# Patient Record
Sex: Female | Born: 2000 | Race: Black or African American | Hispanic: No | Marital: Single
Health system: Southern US, Community
[De-identification: ages and names within clinical notes are randomized; demographics above are authoritative.]

---

## 2021-03-10 ENCOUNTER — Encounter (HOSPITAL_COMMUNITY): Payer: Self-pay

## 2021-03-10 ENCOUNTER — Emergency Department (HOSPITAL_COMMUNITY): Payer: 59

## 2021-03-10 ENCOUNTER — Emergency Department (HOSPITAL_COMMUNITY)
Admission: EM | Admit: 2021-03-10 | Discharge: 2021-03-10 | Disposition: A | Discharge status: Home / Self Care | Payer: 59 | Attending: Emergency Medicine | Admitting: Emergency Medicine

## 2021-03-10 DIAGNOSIS — J189 Pneumonia, unspecified organism: Secondary | ICD-10-CM

## 2021-03-10 DIAGNOSIS — J181 Lobar pneumonia, unspecified organism: Secondary | ICD-10-CM | POA: Insufficient documentation

## 2021-03-10 DIAGNOSIS — R079 Chest pain, unspecified: Secondary | ICD-10-CM | POA: Diagnosis present

## 2021-03-10 LAB — CBC WITH DIFFERENTIAL/PLATELET
Abs Immature Granulocytes: 0.02 10*3/uL (ref 0.00–0.07)
Basophils Absolute: 0 10*3/uL (ref 0.0–0.1)
Basophils Relative: 1 %
Eosinophils Absolute: 0.1 10*3/uL (ref 0.0–0.5)
Eosinophils Relative: 1 %
HCT: 35.8 % — ABNORMAL LOW (ref 36.0–46.0)
Hemoglobin: 11.2 g/dL — ABNORMAL LOW (ref 12.0–15.0)
Immature Granulocytes: 0 %
Lymphocytes Relative: 15 %
Lymphs Abs: 1.2 10*3/uL (ref 0.7–4.0)
MCH: 28.1 pg (ref 26.0–34.0)
MCHC: 31.3 g/dL (ref 30.0–36.0)
MCV: 89.7 fL (ref 80.0–100.0)
Monocytes Absolute: 0.6 10*3/uL (ref 0.1–1.0)
Monocytes Relative: 7 %
Neutro Abs: 6.3 10*3/uL (ref 1.7–7.7)
Neutrophils Relative %: 76 %
Platelets: 317 10*3/uL (ref 150–400)
RBC: 3.99 MIL/uL (ref 3.87–5.11)
RDW: 14 % (ref 11.5–15.5)
WBC: 8.2 10*3/uL (ref 4.0–10.5)
nRBC: 0 % (ref 0.0–0.2)

## 2021-03-10 LAB — BASIC METABOLIC PANEL
Anion gap: 9 (ref 5–15)
BUN: 12 mg/dL (ref 6–20)
CO2: 25 mmol/L (ref 22–32)
Calcium: 9.4 mg/dL (ref 8.9–10.3)
Chloride: 104 mmol/L (ref 98–111)
Creatinine, Ser: 0.71 mg/dL (ref 0.44–1.00)
GFR, Estimated: >60 mL/min (ref >60)
Glucose, Bld: 95 mg/dL (ref 70–99)
Potassium: 3.7 mmol/L (ref 3.5–5.1)
Sodium: 138 mmol/L (ref 135–145)

## 2021-03-10 LAB — D-DIMER, QUANTITATIVE: D-Dimer, Quant: 0.36 ug/mL-FEU (ref 0.00–0.50)

## 2021-03-10 LAB — I-STAT BETA HCG BLOOD, ED (MC, WL, AP ONLY): I-stat hCG, quantitative: <5 m[IU]/mL (ref <5)

## 2021-03-10 MED ORDER — AMOXICILLIN 500 MG PO CAPS: 500.0000 mg | ORAL_CAPSULE | Freq: Three times a day (TID) | ORAL | 0 refills | Status: AC

## 2021-03-10 NOTE — ED Triage Notes (Signed)
Patient arrived with complaints of left sided chest pain that started yesterday, worsens with a deep breath. Declines any lightheaded, dizziness, or NV.

## 2021-03-10 NOTE — ED Notes (Signed)
Patient transported to X-ray 

## 2021-03-10 NOTE — ED Provider Notes (Signed)
[facility]PT Provider Note   CSN: [id number] Arrival date & time: 03/10/21  0144     History Chief Complaint  Patient presents with  . Chest Pain    Sheryl Wong is a 20 y.o. female.  Patient presents to the emergency department with a chief complaint of chest pain.  She states that she has left-sided chest pain that started yesterday.  It is worse when she takes a deep breath.  She denies any fever, chills, cough.  Denies any lightheadedness dizziness, nausea or vomiting.  Denies any sick contacts.  Denies any treatments prior to arrival.  Denies any history of PE or DVT.  Denies any recent travel, surgery, or immobilization.  The history is provided by the patient. No language interpreter was used.       History reviewed. No pertinent past medical history.  There are no problems to display for this patient.   History reviewed. No pertinent surgical history.   OB History   No obstetric history on file.     No family history on file.     Home Medications Prior to Admission medications   Not on File    Allergies    Patient has no known allergies.  Review of Systems   Review of Systems  All other systems reviewed and are negative.   Physical Exam Updated Vital Signs BP 131/75 (BP Location: Left Arm)   Pulse 94   Temp 98.2 F (36.8 C) (Oral)   Resp 20   LMP 02/24/2021   SpO2 100%   Physical Exam Vitals and nursing note reviewed.  Constitutional:      General: She is not in acute distress.    Appearance: She is well-developed.  HENT:     Head: Normocephalic and atraumatic.  Eyes:     Conjunctiva/sclera: Conjunctivae normal.  Cardiovascular:     Rate and Rhythm: Normal rate and regular rhythm.     Heart sounds: No murmur heard.   Pulmonary:     Effort: Pulmonary effort is normal. No respiratory distress.     Breath sounds: Normal breath sounds.  Abdominal:     Palpations: Abdomen is soft.     Tenderness:  There is no abdominal tenderness.  Musculoskeletal:        General: Normal range of motion.     Cervical back: Neck supple.  Skin:    General: Skin is warm and dry.  Neurological:     Mental Status: She is alert and oriented to person, place, and time.  Psychiatric:        Mood and Affect: Mood normal.        Behavior: Behavior normal.     ED Results / Procedures / Treatments   Labs (all labs ordered are listed, but only abnormal results are displayed) Labs Reviewed  CBC WITH DIFFERENTIAL/PLATELET  BASIC METABOLIC PANEL  D-DIMER, QUANTITATIVE    EKG EKG Interpretation  Date/Time:  Sunday March 10 2021 02:00:08 EDT Ventricular Rate:  94 PR Interval:  145 QRS Duration: 77 QT Interval:  333 QTC Calculation: 417 R Axis:   88 Text Interpretation: Sinus rhythm Probable left atrial enlargement Nonspecific T abnormalities, anterior leads No previous ECGs available Confirmed by Paula Libra ([id number]) on 03/10/2021 2:08:23 AM   Radiology DG Chest 2 View  Result Date: 03/10/2021 CLINICAL DATA:  Chest pain, left-sided, worse with deep inspiration EXAM: CHEST - 2 VIEW COMPARISON:  None. FINDINGS: Patchy left mid lung and left perihilar opacity with some  mild associated airways thickening. No pneumothorax. No effusion. The cardiomediastinal contours are unremarkable. Mild dextrocurvature of the spine. No acute osseous abnormality or suspicious osseous lesion. Bilateral metallic nipple ornamentation. Telemetry leads overlie the chest and abdomen IMPRESSION: Patchy left mid lung and left perihilar opacity with some associated airways thickening, suspicious for a bronchopneumonia. Electronically Signed   By: Kreg Shropshire M.D.   On: 03/10/2021 02:17    Procedures Procedures   Medications Ordered in ED Medications - No data to display  ED Course  I have reviewed the triage vital signs and the nursing notes.  Pertinent labs & imaging results that were available during my care of the  patient were reviewed by me and considered in my medical decision making (see chart for details).    MDM Rules/Calculators/A&P                          Patient here with chest pain that started yesterday.  She reports pain with inspiration.  She denies any fever, chills, cough.  Chest x-ray shows patchy left mid lung opacity consistent with bronchopneumonia.  Given that she has not had cough or fever, will check D-dimer.  D-dimer is negative, I think PE is less likely.  Will cover with amoxicillin.  Recommend close return precautions if symptoms change or worsen.  Patient understands agrees the plan.  She is stable ready for discharge. Final Clinical Impression(s) / ED Diagnoses Final diagnoses:  Community acquired pneumonia of left lower lobe of lung    Rx / DC Orders ED Discharge Orders    None       Roxy Horseman, PA-C 03/10/21 0418    Paula Libra, MD 03/10/21 [phone]

## 2023-04-16 IMAGING — CR DG CHEST 2V
2 series · 2 of 2 positions shown · non-contrast
Comparison: None.

CLINICAL DATA: Chest pain, left-sided, worse with deep inspiration

EXAM:
CHEST - 2 VIEW

[w chest pa]
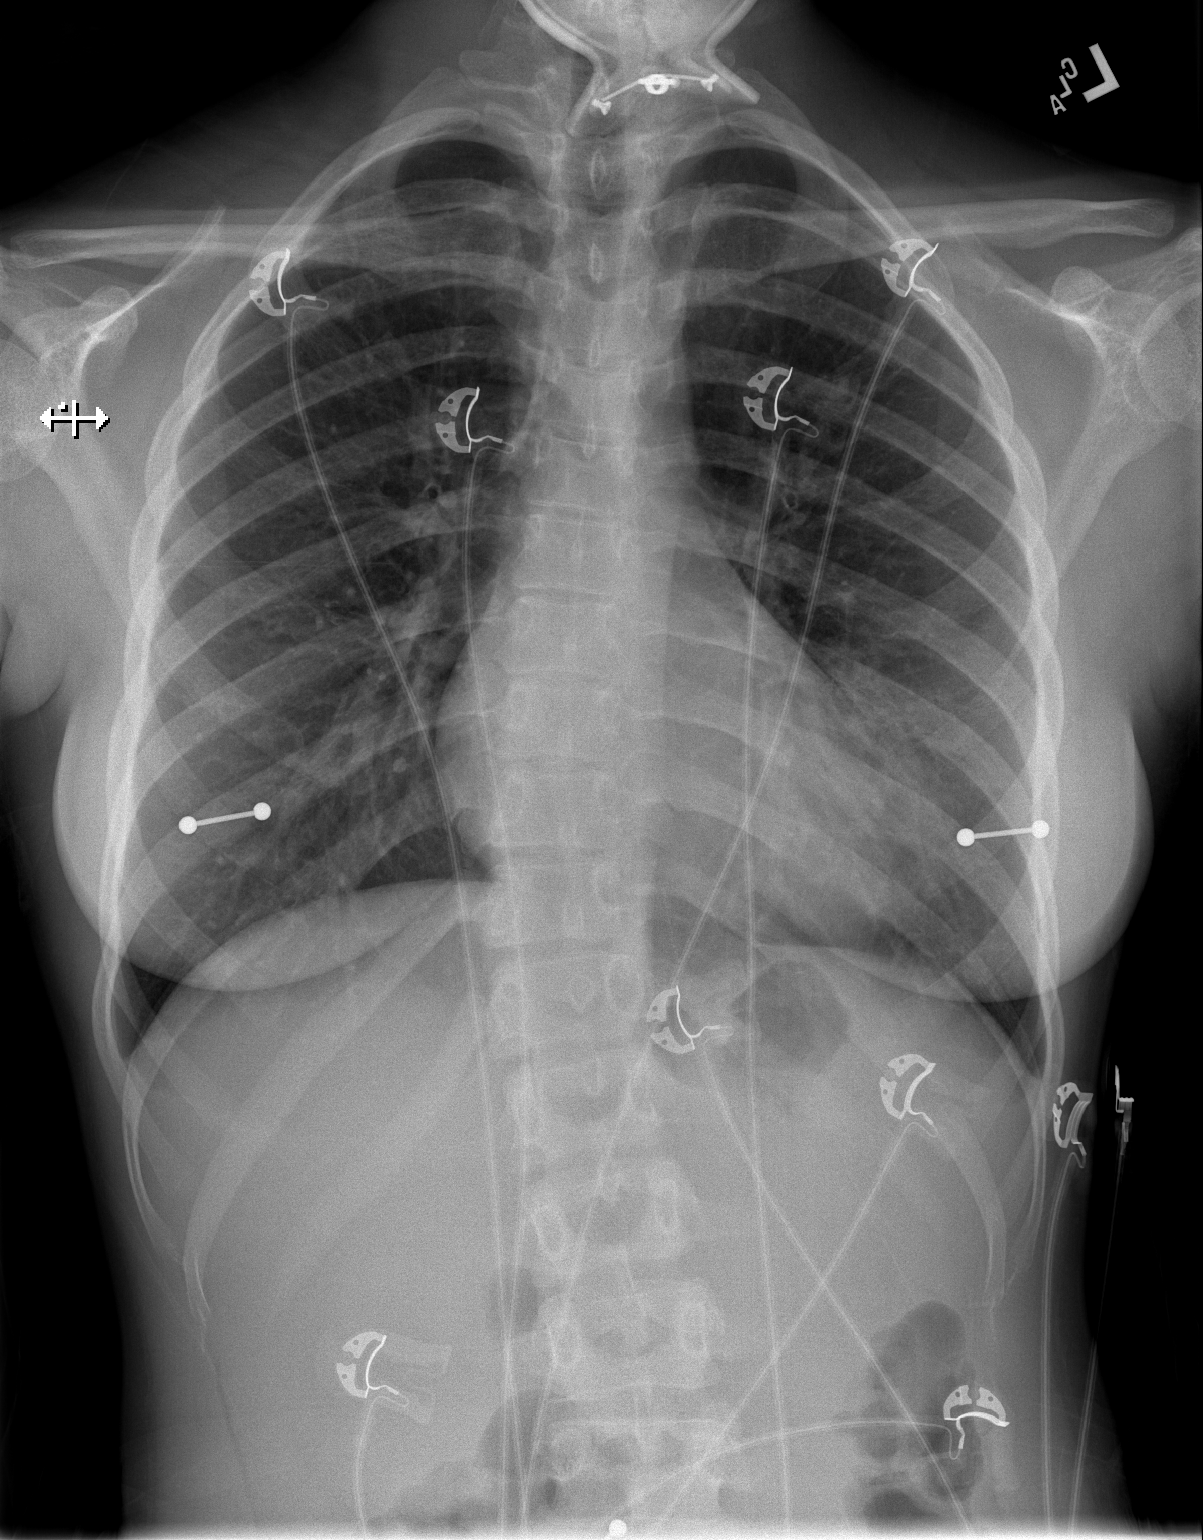

[w chest lat]
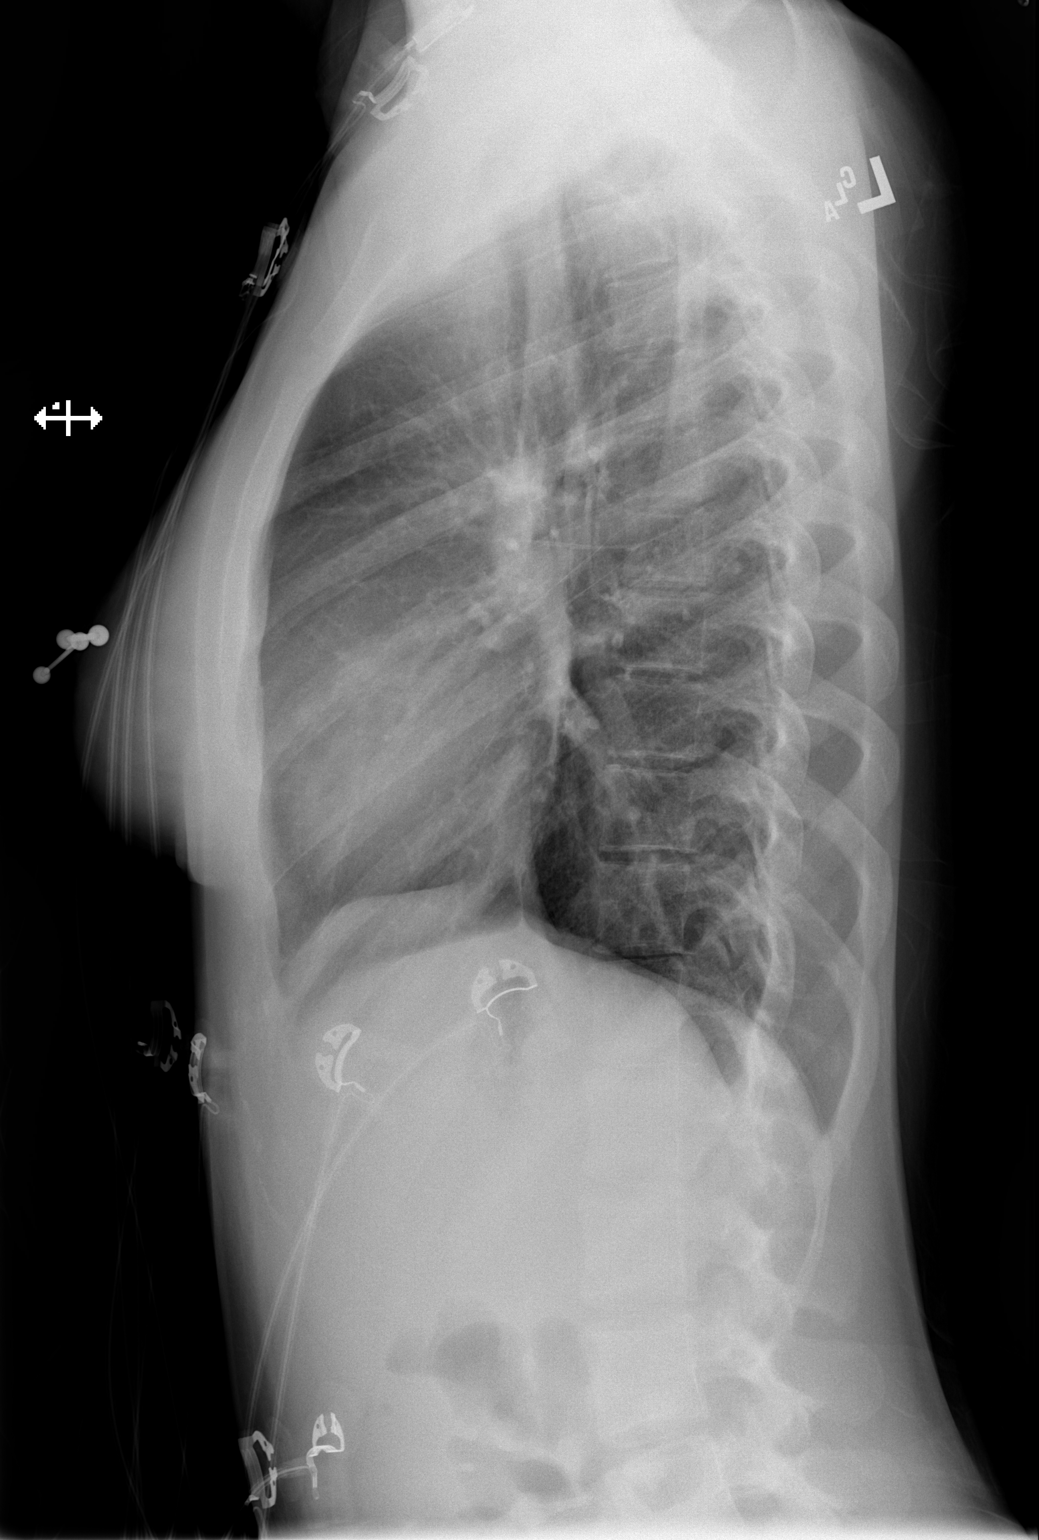

[2 of 2 positions shown; findings below may reference images not displayed]

FINDINGS: Patchy left mid lung and left perihilar opacity with some mild
associated airways thickening. No pneumothorax. No effusion. The
cardiomediastinal contours are unremarkable. Mild dextrocurvature of
the spine. No acute osseous abnormality or suspicious osseous
lesion. Bilateral metallic nipple ornamentation. Telemetry leads
overlie the chest and abdomen
IMPRESSION: Patchy left mid lung and left perihilar opacity with some associated
airways thickening, suspicious for a bronchopneumonia.
